# Patient Record
Sex: Female | Born: 1945 | Race: White | Hispanic: No | Marital: Single
Health system: Southern US, Community
[De-identification: ages and names within clinical notes are randomized; demographics above are authoritative.]

---

## 2004-09-12 ENCOUNTER — Other Ambulatory Visit: Payer: Self-pay

## 2005-03-21 ENCOUNTER — Ambulatory Visit: Payer: Self-pay | Admitting: Internal Medicine

## 2005-05-09 ENCOUNTER — Other Ambulatory Visit: Payer: Self-pay

## 2005-05-10 ENCOUNTER — Ambulatory Visit: Payer: Self-pay | Admitting: General Practice

## 2005-09-05 ENCOUNTER — Ambulatory Visit: Payer: Self-pay | Admitting: Internal Medicine

## 2006-03-08 ENCOUNTER — Ambulatory Visit: Payer: Self-pay | Admitting: Internal Medicine

## 2006-07-05 ENCOUNTER — Ambulatory Visit: Payer: Self-pay | Admitting: Internal Medicine

## 2007-05-14 ENCOUNTER — Ambulatory Visit: Payer: Self-pay | Admitting: Internal Medicine

## 2007-06-24 ENCOUNTER — Ambulatory Visit: Payer: Self-pay | Admitting: Gastroenterology

## 2007-07-07 ENCOUNTER — Ambulatory Visit: Payer: Self-pay | Admitting: Gastroenterology

## 2008-02-26 ENCOUNTER — Ambulatory Visit: Payer: Self-pay | Admitting: Internal Medicine

## 2009-01-06 ENCOUNTER — Ambulatory Visit: Payer: Self-pay | Admitting: Internal Medicine

## 2009-03-02 ENCOUNTER — Ambulatory Visit: Payer: Self-pay | Admitting: Internal Medicine

## 2010-12-28 ENCOUNTER — Ambulatory Visit: Payer: Self-pay | Admitting: Internal Medicine

## 2011-01-08 ENCOUNTER — Emergency Department: Payer: Self-pay | Admitting: Emergency Medicine

## 2011-02-08 ENCOUNTER — Ambulatory Visit: Payer: Self-pay

## 2011-08-01 ENCOUNTER — Ambulatory Visit: Payer: Self-pay | Admitting: General Practice

## 2011-09-13 ENCOUNTER — Ambulatory Visit: Payer: Self-pay | Admitting: General Practice

## 2011-12-10 ENCOUNTER — Ambulatory Visit: Payer: Self-pay | Admitting: General Practice

## 2011-12-10 LAB — CBC
MCHC: 34.4 g/dL (ref 32.0–36.0)
RBC: 4.23 10*6/uL (ref 3.80–5.20)
WBC: 9.1 10*3/uL (ref 3.6–11.0)

## 2011-12-10 LAB — URINALYSIS, COMPLETE
Glucose,UR: NEGATIVE mg/dL (ref 0–75)
Nitrite: NEGATIVE
Specific Gravity: 1.018 (ref 1.003–1.030)
Squamous Epithelial: 2

## 2011-12-10 LAB — BASIC METABOLIC PANEL
Anion Gap: 12 (ref 7–16)
Calcium, Total: 9.2 mg/dL (ref 8.5–10.1)
Co2: 27 mmol/L (ref 21–32)
EGFR (African American): >60

## 2011-12-10 LAB — SEDIMENTATION RATE: Erythrocyte Sed Rate: 12 mm/hr (ref 0–30)

## 2011-12-10 LAB — MRSA PCR SCREENING

## 2011-12-10 LAB — PROTIME-INR: INR: 1

## 2011-12-12 LAB — URINE CULTURE

## 2011-12-21 ENCOUNTER — Inpatient Hospital Stay: Payer: Self-pay | Admitting: General Practice

## 2011-12-22 LAB — BASIC METABOLIC PANEL
Anion Gap: 9 (ref 7–16)
Chloride: 100 mmol/L (ref 98–107)
Co2: 25 mmol/L (ref 21–32)
Creatinine: 0.66 mg/dL (ref 0.60–1.30)
Osmolality: 270 (ref 275–301)

## 2011-12-23 LAB — BASIC METABOLIC PANEL
BUN: 7 mg/dL (ref 7–18)
Calcium, Total: 8 mg/dL — ABNORMAL LOW (ref 8.5–10.1)
Chloride: 98 mmol/L (ref 98–107)
Creatinine: 0.47 mg/dL — ABNORMAL LOW (ref 0.60–1.30)
EGFR (Non-African Amer.): >60
Glucose: 80 mg/dL (ref 65–99)
Osmolality: 265 (ref 275–301)
Potassium: 3.3 mmol/L — ABNORMAL LOW (ref 3.5–5.1)
Sodium: 134 mmol/L — ABNORMAL LOW (ref 136–145)

## 2011-12-23 LAB — HEMOGLOBIN: HGB: 11 g/dL — ABNORMAL LOW (ref 12.0–16.0)

## 2011-12-24 LAB — BASIC METABOLIC PANEL
BUN: 8 mg/dL (ref 7–18)
Calcium, Total: 8.9 mg/dL (ref 8.5–10.1)
Chloride: 100 mmol/L (ref 98–107)
Co2: 28 mmol/L (ref 21–32)
Creatinine: 0.61 mg/dL (ref 0.60–1.30)
Glucose: 65 mg/dL (ref 65–99)
Osmolality: 270 (ref 275–301)
Potassium: 3.4 mmol/L — ABNORMAL LOW (ref 3.5–5.1)

## 2012-04-08 ENCOUNTER — Ambulatory Visit: Payer: Self-pay | Admitting: Internal Medicine

## 2012-11-04 ENCOUNTER — Ambulatory Visit: Payer: Self-pay | Admitting: Orthopedic Surgery

## 2013-01-05 ENCOUNTER — Ambulatory Visit: Payer: Self-pay | Admitting: Gastroenterology

## 2013-02-04 ENCOUNTER — Other Ambulatory Visit: Payer: Self-pay | Admitting: Gastroenterology

## 2013-02-04 DIAGNOSIS — K5730 Diverticulosis of large intestine without perforation or abscess without bleeding: Secondary | ICD-10-CM

## 2013-02-18 ENCOUNTER — Ambulatory Visit
Admission: RE | Admit: 2013-02-18 | Discharge: 2013-02-18 | Disposition: A | Discharge status: Home / Self Care | Payer: No Typology Code available for payment source | Source: Ambulatory Visit | Attending: Gastroenterology | Admitting: Gastroenterology

## 2013-02-18 DIAGNOSIS — K5730 Diverticulosis of large intestine without perforation or abscess without bleeding: Secondary | ICD-10-CM

## 2013-05-19 ENCOUNTER — Ambulatory Visit: Payer: Self-pay | Admitting: Gastroenterology

## 2013-06-18 ENCOUNTER — Ambulatory Visit: Payer: Self-pay | Admitting: Gastroenterology

## 2013-10-02 IMAGING — CT CT VIRTUAL COLONOSCOPY DIAGNOSTIC
3 of 6 series · 12 of 36 positions shown, 18 images · non-contrast
Comparison: None.

CLINICAL DATA: Incomplete optical colonoscopy.  Left lower quadrant
pain and chronic diarrhea.

CT VIRTUAL COLONOSCOPY DIAGNOSTIC
TECHNIQUE: The patient was given a standard LoSo bowel
preparation with Gastrografin and barium for fluid and stool
tagging respectively.  The quality of the bowel preparation is
moderate.  Automated CO2 insufflation of the colon was performed
prior to image acquisition and colonic distention is for moderate
in the sigmoid colon, otherwise moderate to excellent..  Image post
processing was used to generate a 3D endoluminal fly-through
projection of the colon and to electronically subtract stool/fluid
as appropriate.

[Series 2: supine (id) · axial · 0.74mm/px · z∈[-396,-92]mm · 7 of 326 slices shown, 12 images]
[im 41/326  soft-tissue]
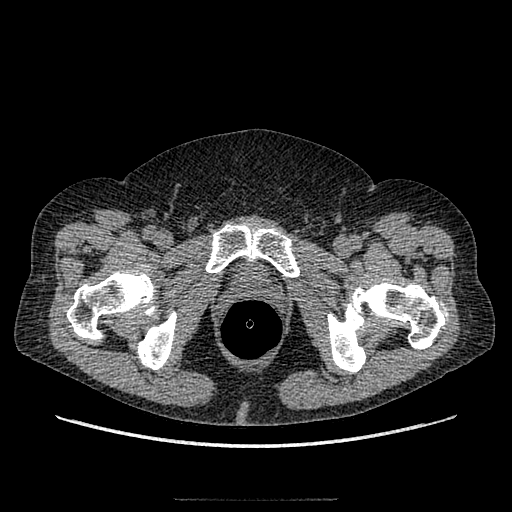
[im 41/326  bone]
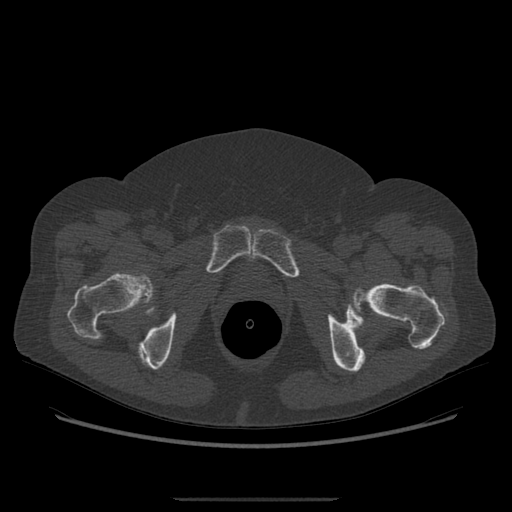
[im 82/326  soft-tissue]
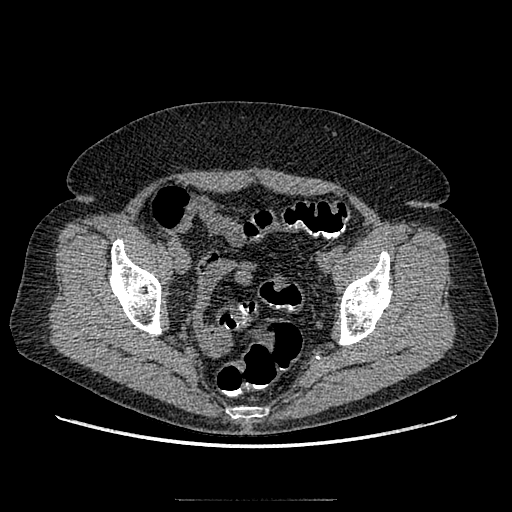
[im 122/326  soft-tissue]
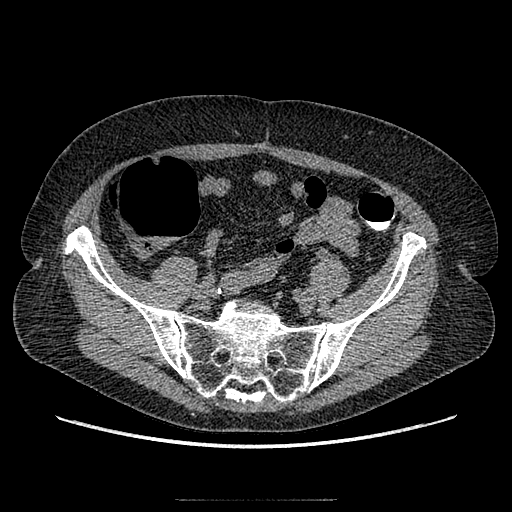
[im 163/326  soft-tissue]
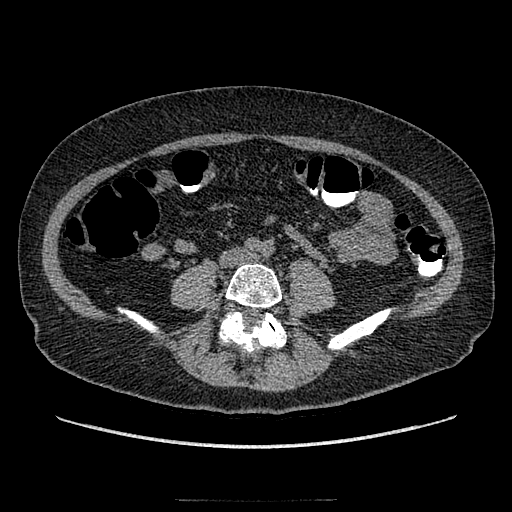
[im 163/326  lung]
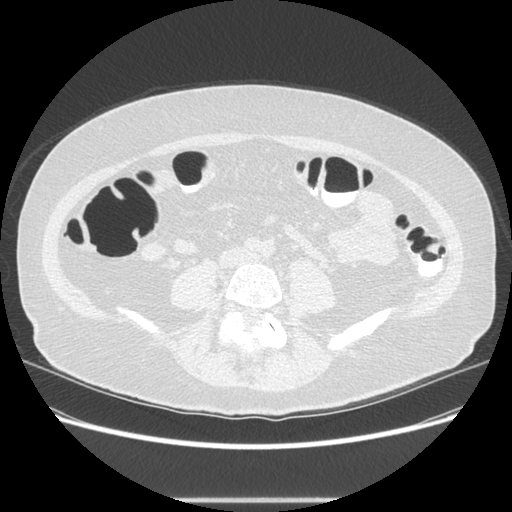
[im 204/326  soft-tissue]
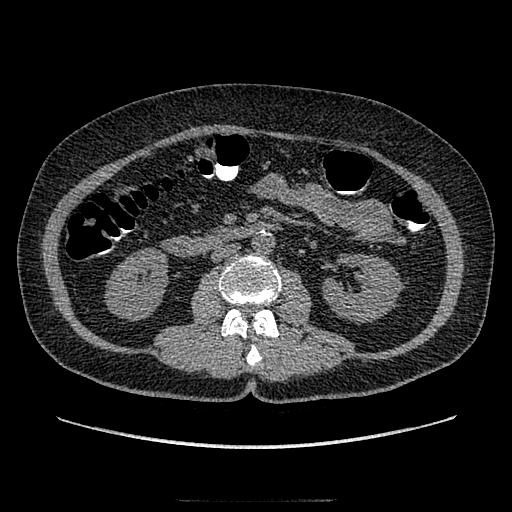
[im 204/326  lung]
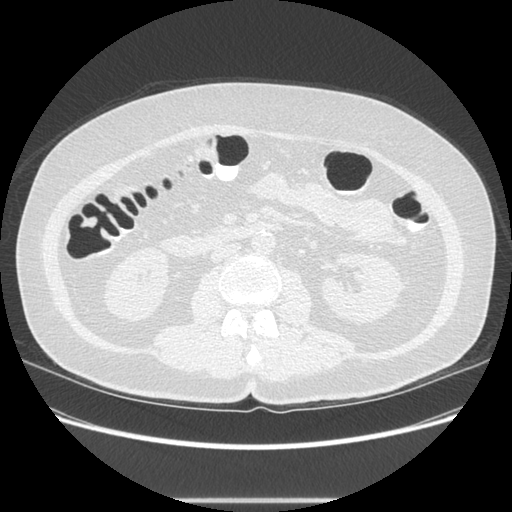
[im 244/326  soft-tissue]
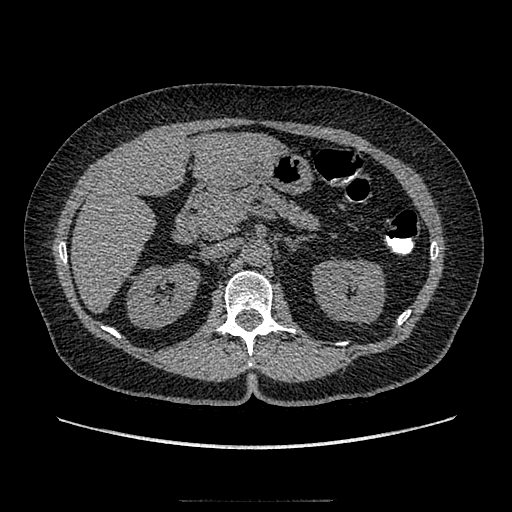
[im 244/326  lung]
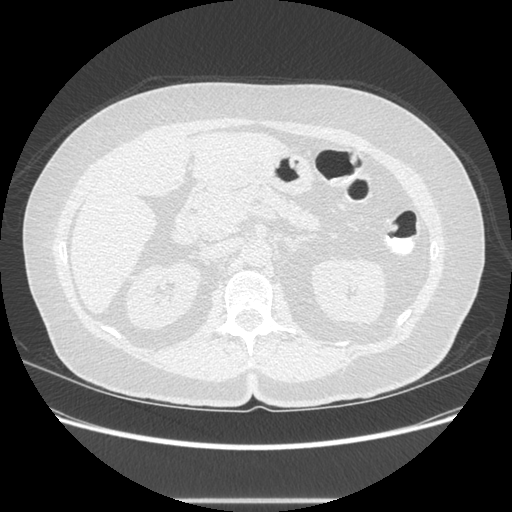
[im 285/326  soft-tissue]
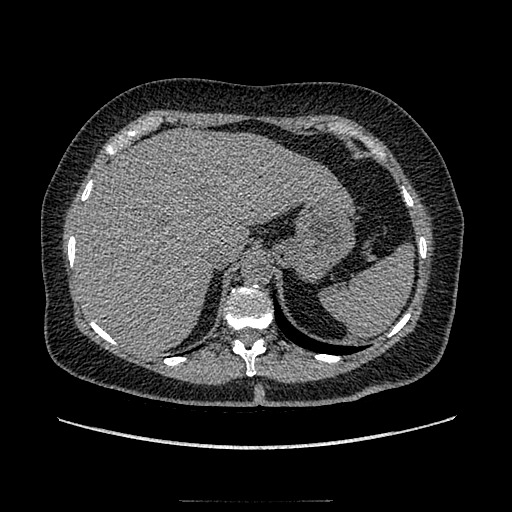
[im 285/326  lung]
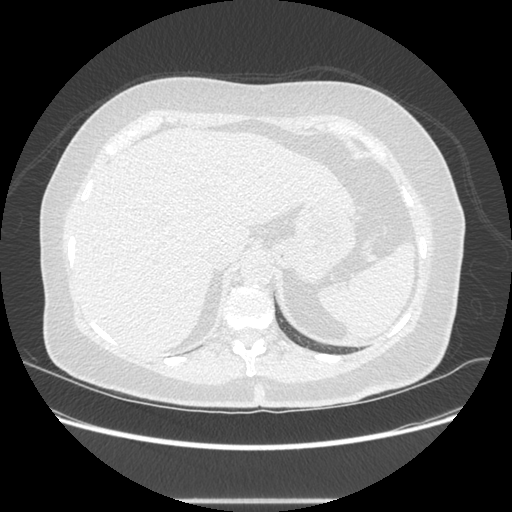

[Series 6: prone (id) · axial · 0.70mm/px · z∈[-394,-244]mm · 4 of 321 slices shown]
[im 41/321  soft-tissue]
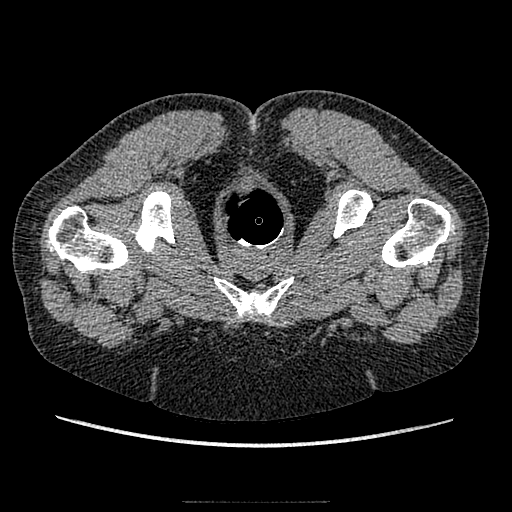
[im 81/321  soft-tissue]
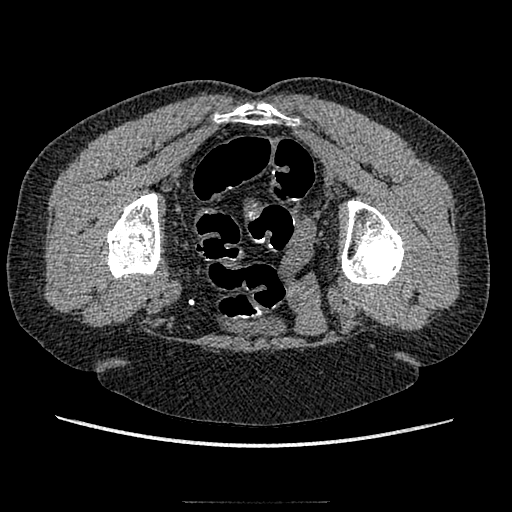
[im 121/321  soft-tissue]
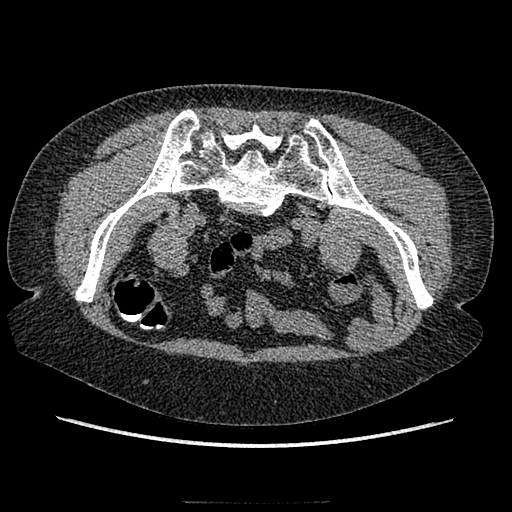
[im 161/321  soft-tissue]
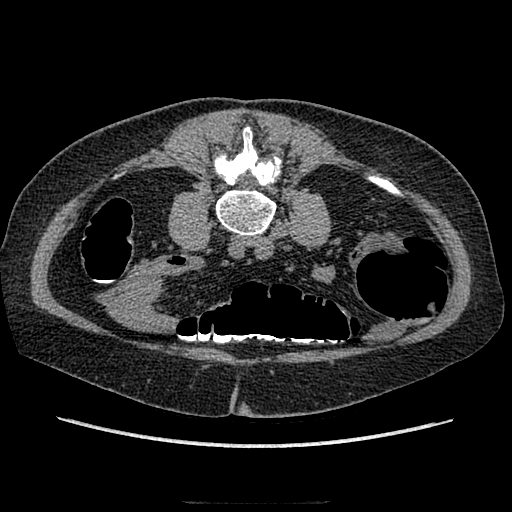

[Series 601: coronal body · coronal · 0.79mm/px · 1 of 109 slices shown, 2 images]
[im 37/109  soft-tissue]
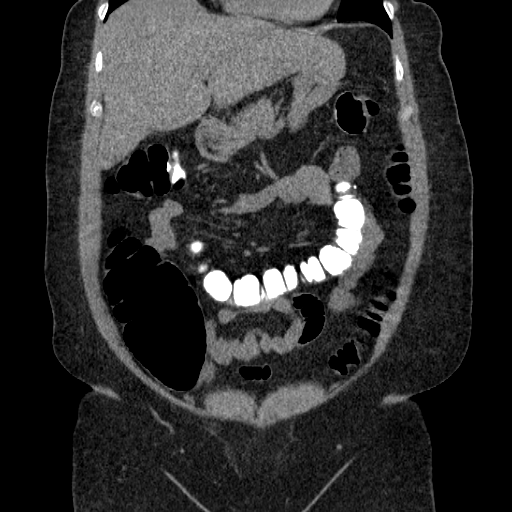
[im 37/109  bone]
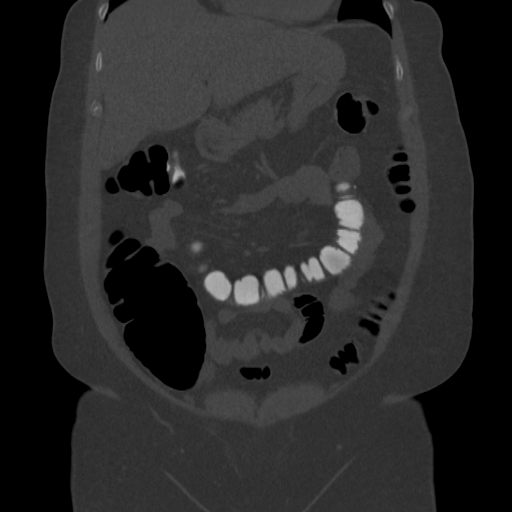

[12 of 36 positions shown; findings below may reference images not displayed]

FINDINGS: Sigmoid colon is rather under distended, limiting
evaluation.  Otherwise, no definite polyp, mass or stricture.
Scattered diverticula.
IMPRESSION: Sigmoid colon is inadequately distended, limiting evaluation.
Otherwise, no definite polyp, mass or stricture.

Virtual colonoscopy is not designed to detect diminutive polyps
(i.e., less than or equal to 5 mm), the presence or absence of
which may not affect clinical management

CT ABDOMEN AND PELVIS WITHOUT CONTRAST
FINDINGS: Lung bases show no acute findings.  Heart size normal.
Pre pericardiac lymph node is sub centimeter in size.  No
pericardial or pleural effusion.

An 8 mm low attenuation lesion in the right hepatic lobe is too
small to characterize.  Cholecystectomy.  Liver, gallbladder,
adrenal glands and right kidney are otherwise unremarkable.  A
cm low attenuation lesion in the upper pole left kidney is
difficult to definitively characterize without postcontrast
imaging.  Spleen, pancreas, stomach and small bowel are
unremarkable.  Colon is discussed in the virtual colonoscopy
section of this report.

Atherosclerotic calcification of the arterial vasculature without
abdominal aortic aneurysm.  No pathologically enlarged lymph nodes.
No free fluid.  Hysterectomy.  Curvilinear calcification or clip in
the inferior left omentum.  No worrisome lytic or sclerotic
lesions.
IMPRESSION: No acute findings in the abdomen or pelvis.

## 2013-12-31 IMAGING — US ABDOMEN ULTRASOUND LIMITED
1 series · 13 of 25 positions shown · non-contrast
Comparison: none

REASON FOR EXAM: liver and kidneys  liver lesion  abd pain abn GI xray
COMMENTS:

[Series 1: abdomen ultrasound limited · 0.28mm/px · 13 of 80 slices shown]
[im 1/80]
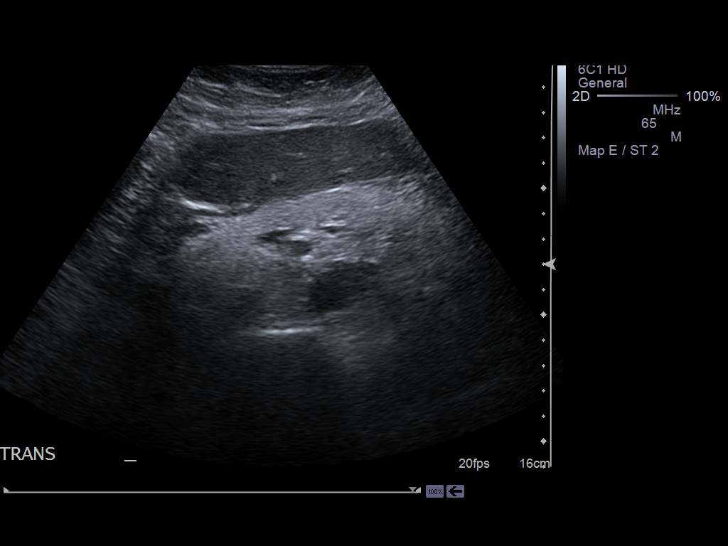
[im 7/80]
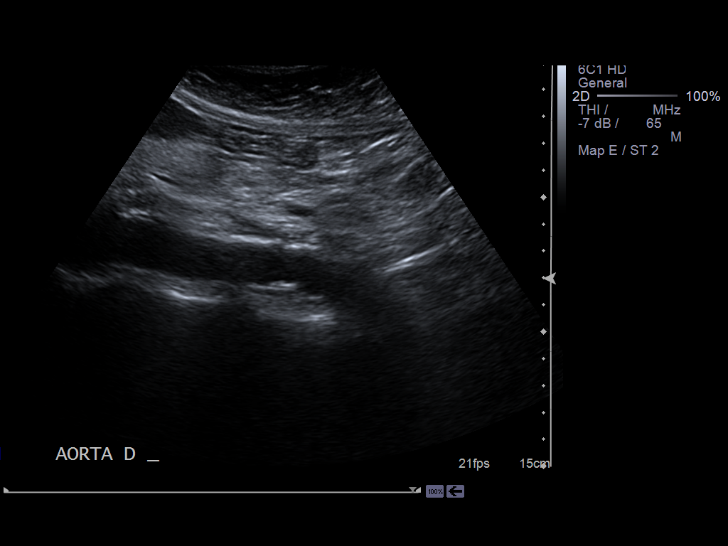
[im 14/80]
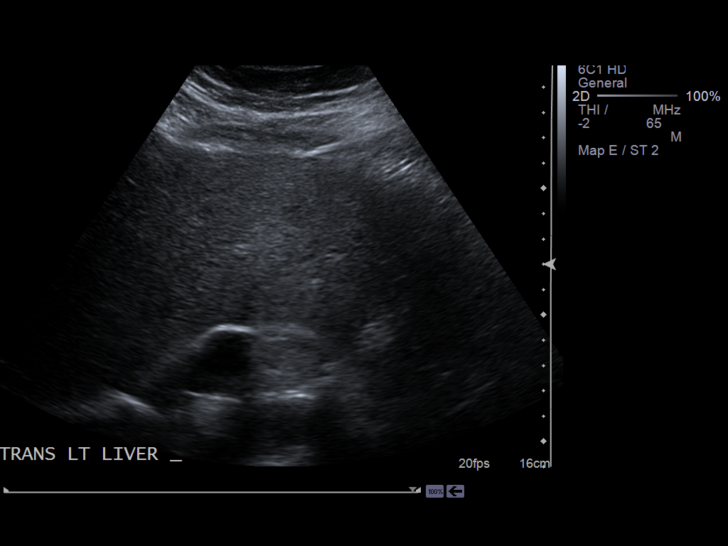
[im 20/80]
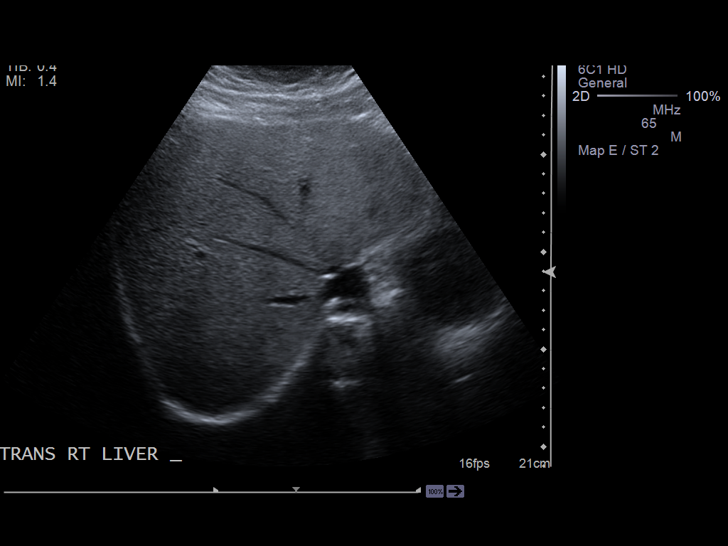
[im 27/80]
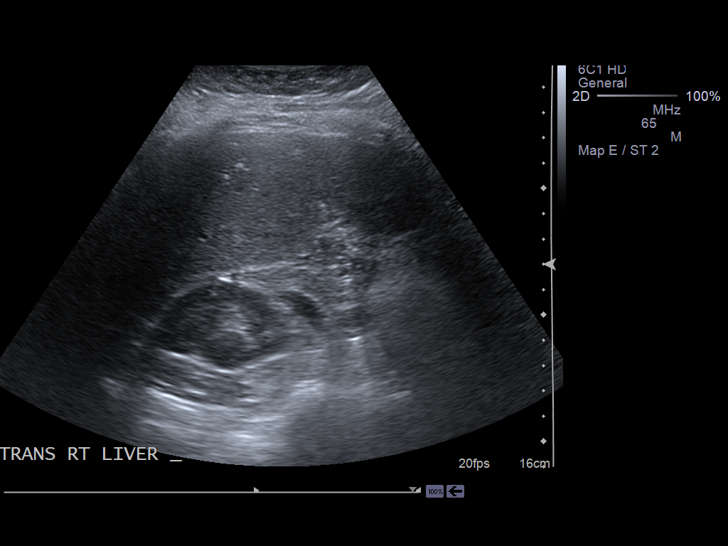
[im 33/80]
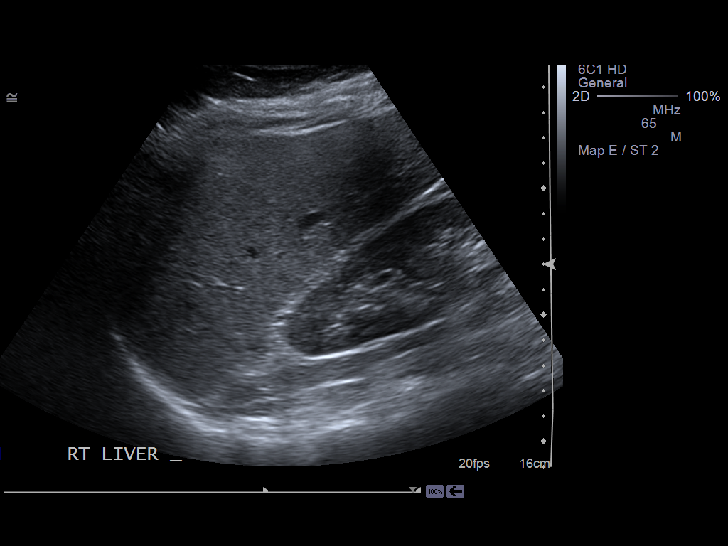
[im 40/80]
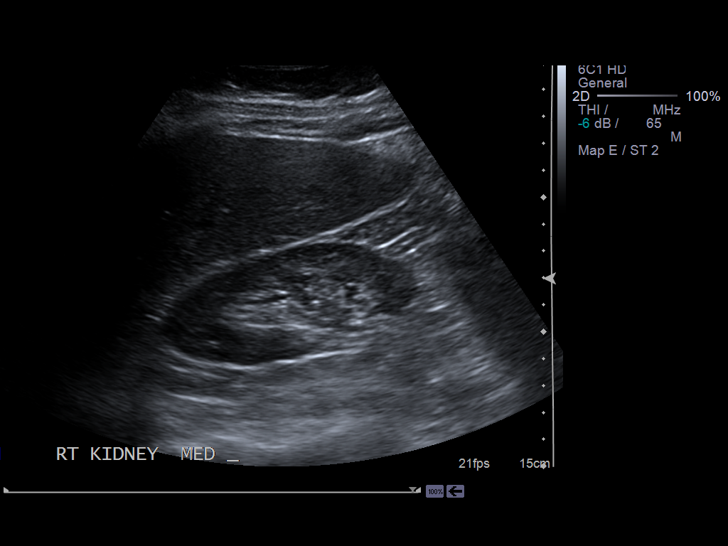
[im 47/80]
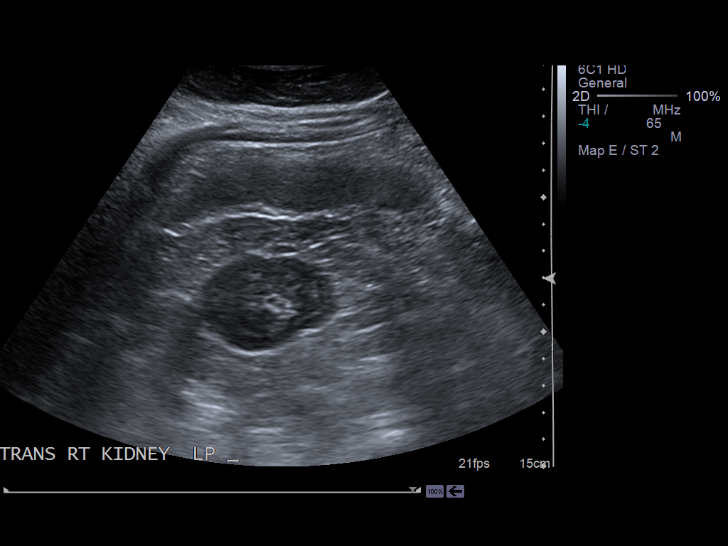
[im 53/80]
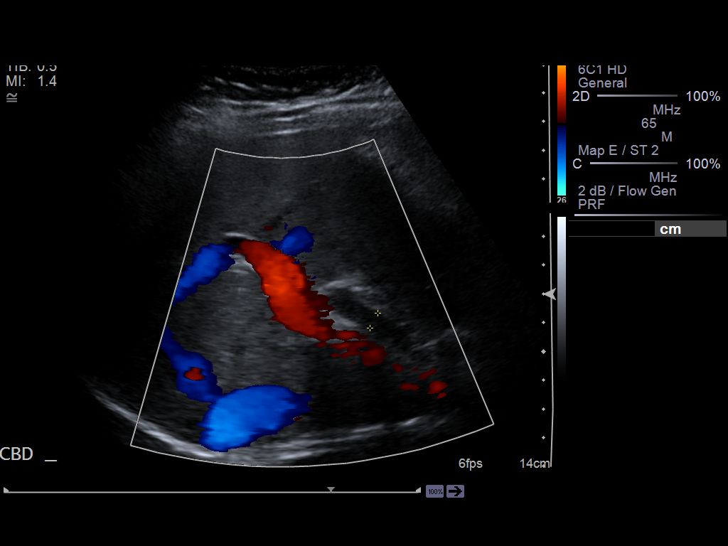
[im 60/80]
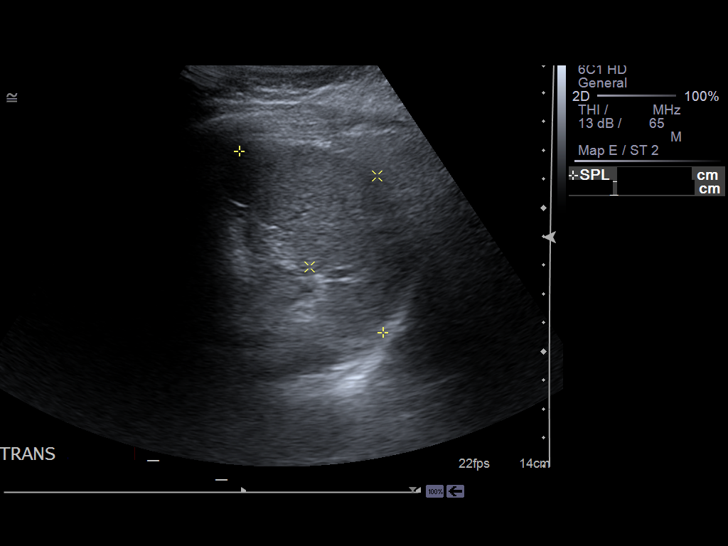
[im 66/80]
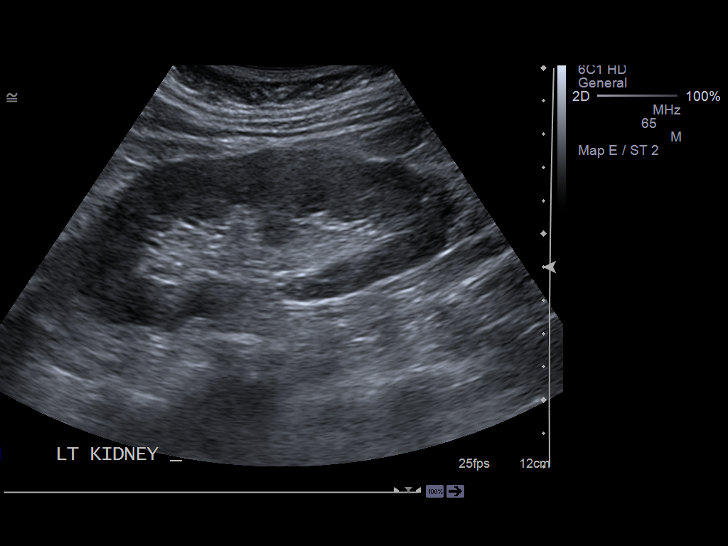
[im 73/80]
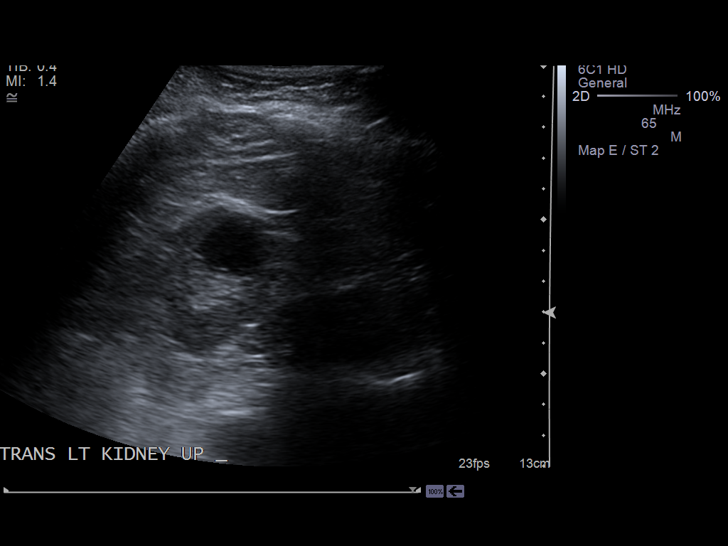
[im 80/80]
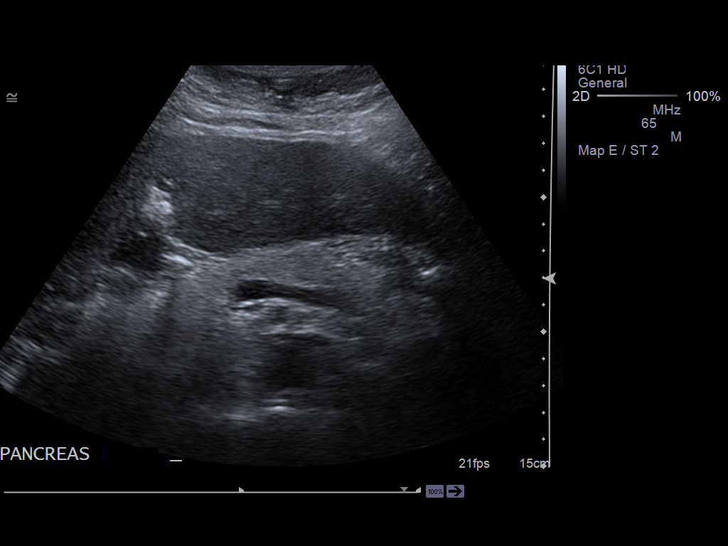

[13 of 25 positions shown; findings below may reference images not displayed]

PROCEDURE:     BANDO - BANDO ABDOMEN UPPER GENERAL  - May 19, 2013 [DATE]

RESULT:     The liver exhibits normal echotexture with no focal mass nor
ductal dilation. Portal venous flow is normal in direction toward the liver.
The gallbladder is surgically absent. There is no positive sonographic
Murphy's sign. The common bile duct measures 0.9 mm in diameter. The
pancreas exhibits no focal mass or ductal dilation where visualized. The
spleen is normal in size measuring 8.5 cm in long axis. The abdominal aorta
and inferior vena cava are normal in appearance. The kidneys exhibit no
evidence of obstruction. The right kidney measures 11.1 cm in length and
contains a lower pole presumed cyst measuring approximately 9 x 6 x 7 mm.
The left kidney measures 11.9 cm in length and contains a 2.5 x 2.7 x 2.4 cm
diameter upper pole cyst.
IMPRESSION: 1. No hepatic masses are demonstrated.
2. There is likely a subcentimeter cyst in the lower pole of the right
kidney and a cyst measuring 2.7 cm in greatest dimension in the upper pole
of the left kidney.
3. The spleen, visualized portions of the pancreas, and abdominal aorta are
normal in appearance.

Given the findings on the virtual colonoscopy in the lack of definite
abnormality on the current ultrasound, a CT scan of the abdomen and pelvis
is recommended.

[REDACTED]

## 2015-02-18 NOTE — Op Note (Signed)
PATIENT NAME:  [name], [name] DATE OF BIRTH:  1946-07-16  DATE OF PROCEDURE:  11/04/2012  PREOPERATIVE DIAGNOSIS: Right de Quervain's stenosing tenosynovitis.   POSTOPERATIVE DIAGNOSIS: Right de Quervain's stenosing tenosynovitis.   PROCEDURE PERFORMED: De Quervain's release.   ANESTHESIA: General.    SURGEON: Leitha SchullerMichael J. Lenna Hagarty, MD   DESCRIPTION OF PROCEDURE: The patient was brought to the operating room, and after adequate anesthesia was obtained, the right arm was prepped and draped in the usual sterile fashion, with a tourniquet applied to the upper arm. After appropriate patient identification and timeout procedures were carried out, the tourniquet was raised to 250 mmHg. An incision was made over the radial styloid over the palpable swollen area just through the skin. The subcutaneous tissue was spread to preserve branches of superficial radial nerve. With retractors placed, the thick extensor retinaculum was incised, and there was a large amount of fluid within the tendon sheath. The 2 tendons were identified and released proximally, so there was no longer any impingement. There was a septation between these, and this was also excised. There did not appear to be accessory slips of the tendons. There appeared to be complete release of the tendons at this point, and the tendons were in good condition without evidence of abrasion. The wound was then irrigated and closed with simple interrupted 5-0 nylon skin suture. Then, 10 mL of 0.5% Sensorcaine without epinephrine was infiltrated proximal to the incision for postoperative analgesia. Sterile dressing with Xeroform, 4 x 4, Webril and Ace wrap applied and the tourniquet let down. The patient was sent to the recovery room in stable condition.   ESTIMATED BLOOD LOSS: Minimal.   COMPLICATIONS: None.   SPECIMEN: None.   TOURNIQUET TIME: 10 minutes at 250 mmHg.   ____________________________ Leitha SchullerMichael J. Delesia Martinek,  MD mjm:[email or web address] D: 11/04/2012 23:03:11 ET T: 11/05/2012 06:04:02 ET JOB#: [id number]  cc: Leitha SchullerMichael J. Chareese Sergent, MD, <Dictator> Leitha SchullerMICHAEL J Avie Checo MD ELECTRONICALLY SIGNED 11/05/2012 8:30

## 2015-02-20 NOTE — Op Note (Signed)
PATIENT NAME:  [name], [name] DATE OF BIRTH:  10/04/1946  DATE OF PROCEDURE:  12/21/2011  PREOPERATIVE DIAGNOSIS: Degenerative arthrosis of the right knee.   POSTOPERATIVE DIAGNOSIS: Degenerative arthrosis of the right knee.   PROCEDURE PERFORMED: Right total knee arthroplasty using computer-assisted navigation.   SURGEON: Illene LabradorJames P. Hooten, M.D.   ASSISTANT: Van ClinesJon Wolfe, PA-C (required to maintain retraction throughout the procedure).   ANESTHESIA: Femoral nerve block and spinal.   ESTIMATED BLOOD LOSS: 100 mL.  FLUIDS REPLACED: 1,850 mL of crystalloid.  TOURNIQUET TIME: 101 minutes.  DRAINS: Two medium drains to reinfusion system.   SOFT TISSUE RELEASES: Anterior cruciate ligament, posterior cruciate ligament, deep and superficial medial collateral ligament and patellofemoral ligament.   IMPLANTS UTILIZED: DePuy PFC Sigma size two posterior stabilized femoral component (cemented), size two MBT tibial component (cemented), 32 mm three peg oval dome patella (cemented), and a 12.5 mm stabilized rotating platform polyethylene insert. Gentamicin cement was utilized due to the patient's history of diabetes.   INDICATIONS FOR SURGERY: The patient is a 69 year old female who has been seen for complaints of progressive right knee pain. Previous arthroscopy demonstrated significant degenerative changes to the medial compartment, and serial radiographs demonstrated progression of cartilage space loss along the medial aspect. She had not seen any significant improvements despite conservative nonsurgical intervention. After a discussion of the risks and benefits of surgical intervention, the patient expressed her understanding of the risks and benefits and agreed with plans for surgical intervention.   PROCEDURE IN DETAIL: The patient was brought to the Operating Room and, after adequate femoral nerve block and spinal anesthesia was achieved, a tourniquet was placed on the patient's upper  right thigh. The patient's right knee and leg were cleaned and prepped with alcohol and DuraPrep draped in the usual sterile fashion. A "time-out" was performed as per usual protocol. The right lower extremity was exsanguinated using an Esmarch, and the tourniquet was inflated to 300 mmHg. An anterior longitudinal incision was made followed by a standard mid vastus approach. A large effusion was evacuated. The deep fibers of the medial collateral ligament were elevated in a subperiosteal fashion off of the medial flare of the tibia so as to maintain a continuous soft tissue sleeve. The patella was subluxed laterally and the patellofemoral ligament was incised. Inspection of the knee demonstrated severe degenerative changes with eburnated bone noted especially to the medial compartment. Osteophytes were debrided using a rongeur. Anterior and posterior cruciate ligaments were excised. Two 4.0 mm Schanz pins were inserted into the femur and into the tibia for attachment of the ray of spheres used for computer-assisted navigation. Hip center was identified using a circumduction technique. Distal landmarks were mapped using the computer. Distal femur and proximal tibia were mapped using the computer. Distal femoral cutting guide was positioned using computer-assisted navigation so as to achieve a 5 degree distal valgus cut. Cut was performed and verified using the computer. Distal femur was sized and it was felt that a size two femoral component was appropriate. A size two cutting guide was positioned using computer-assisted navigation and the anterior cut was performed and verified using the computer. This was followed by completion of the posterior and chamfer cuts. Femoral cutting guide for the central box was then positioned and the central box cut was performed.   Attention was then directed to the proximal tibia. Medial and lateral menisci were excised. The extramedullary tibial cutting guide was positioned using  computer-assisted navigation so as to achieve  0 degree varus valgus alignment and 0 degree posterior slope. Cut was performed and verified using the computer. The proximal tibia was sized and it was felt that a size two tibial tray was appropriate. Tibial and femoral trials were inserted followed by insertion of a 10 mm polyethylene insert. The knee was felt to be tight medially. Cobb elevator was used to elevate the superficial fibers of the medial collateral ligament. A 10 mm polyethylene trial was removed and replaced with a 12.5 mm polyethylene trial. This allowed for excellent mediolateral soft tissue balancing with the knee in full extension and in 90 degrees of flexion. Finally, the patella was cut and prepared so as to accommodate a 32 mm three peg oval dome patella. Patellar trial was placed and the knee was placed through a range of motion with excellent patellar tracking appreciated. The femoral trial was removed. Central post hole for the tibial component was reamed followed by insertion of keel punch. Tibial trials were then removed. The cut surfaces of bone were irrigated with copious amounts of normal saline with antibiotic solution using pulsatile lavage and then suctioned dry. Polymethyl methacrylate cement with gentamicin was prepared in the usual fashion using a vacuum mixer. Cement was applied to the cut surface of the proximal tibia as well as along the undersurface of a size two MBT tibial component. The tibial component was positioned and impacted into place. Excess cement was removed using freer elevators. Cement was then applied to the cut surface of the femur as well as along the posterior flanges of a size two posterior stabilized femoral component. Femoral component was positioned and impacted into place. Excess cement was removed using freer elevators. A 12.5 mm stabilized polyethylene trial was inserted and the knee was brought in full extension with steady axial compression applied.  Finally, cement was applied to the backside of a 32 mm three peg oval dome patella and the patellar component was positioned and patellar clamp applied. Excess cement was removed using freer elevators.   After adequate curing of cement, the tourniquet was deflated after total tourniquet time of 101 minutes. Hemostasis was achieved using electrocautery. The knee was irrigated with copious amounts of normal saline with antibiotic solution using pulsatile lavage and then suctioned dry. The knee was inspected for any residual cement debris. 30 mL of 0.25% Marcaine with epinephrine was injected along the posterior capsule. A 12.5 mm stabilized rotating platform polyethylene insert was inserted and the knee was placed through a range of motion. Again, excellent mediolateral soft tissue balancing was appreciated and excellent patellar tracking was appreciated. Two medium drains were placed in the wound bed and brought out through a separate stab incision so as to be attached to a reinfusion system. The medial parapatellar portion of the incision was reapproximated using interrupted sutures of #1 Vicryl. The subcutaneous tissue was approximated in layers using first #0 Vicryl followed by 2-0 Vicryl. Skin was closed with skin staples. A sterile dressing was applied. The patient tolerated the procedure well. She was transported to the recovery room in stable condition.   ____________________________ Illene Labrador. Angie Fava., MD jph:ap D: 12/22/2011 16:03:29 ET T: 12/23/2011 12:06:21 ET JOB#: [id number]  cc: Illene Labrador. Angie Fava., MD, <Dictator> JAMES P Angie Fava MD ELECTRONICALLY SIGNED 12/25/2011 6:22

## 2015-02-20 NOTE — Discharge Summary (Signed)
PATIENT NAME:  [name], [name] MR#:  [id number] DATE OF BIRTH:  January 20, 1946  DATE OF ADMISSION:  12/21/2011 DATE OF DISCHARGE:  12/24/2011   ADMITTING DIAGNOSIS: Degenerative arthrosis of right knee.   DISCHARGE DIAGNOSIS: Degenerative arthrosis of right knee.   HISTORY: The patient is a 69 year old female who [facility] progression of right knee pain. She was four months status post right knee arthroscopy for partial medial meniscectomy as well as chondroplasty of the medial compartment of the knee. 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 The patient had not seen any significant improvement in her right knee despite physical therapy as well as viscous supplementation. She had not seen any improvement with the use of anti-inflammatory medication. At the time of surgery, she was not using any ambulatory aid. She had localized most of the pain along the medial aspect of the knee. Her pain was aggravated with weightbearing activities. She denied any gross locking, swelling, or giving way of the knee. The right knee pain had progressed to the point that it was significantly interfering with her activities of daily living. X-rays taken in the clinic showed narrowing of the medial cartilage space. Early subchondral sclerosis was noted. There was minimal osteophyte formation noted. Patellofemoral degenerative changes were noted. After discussion of the risks and benefits of surgical intervention, the patient expressed her understanding of the risks and benefits and agreed for plans for surgical intervention.   PROCEDURE: Right total knee arthroplasty using computer-assisted navigation.   ANESTHESIA: Femoral nerve block with spinal.   SOFT TISSUE RELEASE: Anterior cruciate ligament, posterior cruciate ligament, deep and superficial medial collateral ligament as well as the patellofemoral ligament.   IMPLANTS UTILIZED:  DePuy PFC Sigma size 2 posterior stabilized femoral component (cemented), size 2 MBT tibial component (cemented), 32 mm three pegged oval dome patella (cemented), and a 12.5 stabilized rotating platform polyethylene insert. Gentamicin cement was used secondary to the patient's history of diabetes.   HOSPITAL COURSE: The patient tolerated the procedure very well. She had no complications. She was then taken to PAC-U where she was stabilized and then transferred to the Orthopedic floor. She began receiving anticoagulation therapy of Lovenox 30 mg sub-Q q.12 hours per anesthesia protocol. She was fitted with TED stockings bilaterally. These were allowed to be removed one hour per eight hour shift. The right one was applied on day two following removal of the Hemovac and dressing change. She was also fitted with the AV-I compression foot pumps set at 80 mmHg. Her calves have been nontender, free of any evidence of any deep venous thromboses of the lower extremity. Negative Homans sign. Heels were elevated off the bed using rolled towels. There has been no tenderness or tissue breakdown noted to either heel.   The patient has denied any chest pain or any shortness of breath. Vital signs have been stable. She has been afebrile. Hemodynamically she was stable. No transfusions were given other than the Autovac transfusion for six hours postoperatively. Laboratory studies following surgery showed she had decreased potassium of 3.3 and subsequently this was supplemented with Klor-Con 20 mEq b.i.d. Follow-up reports showed improvement.   The patient's IV, Foley, and catheter were discontinued on day two along with the dressing change. Polar Care was reapplied to the to the surgical leg maintaining a temperature of 40 to 50 degrees Fahrenheit. Wound was free of any drainage or signs of infection.   Physical  therapy was initiated on day one for gait training and transfers. She has progressed very well. Occupational  therapy was also initiated on day one for activities of daily living and assistive devices.   DISPOSITION: The patient is being discharged to skilled nursing facility in improved stable condition.   DISCHARGE INSTRUCTIONS:  1. She will follow-up in the clinic on March 8th at 8:15 and April 1st at 9:15 with Dr. Ernest Pine.  2. She is to continue with TED stockings bilaterally. These are allowed to be removed one hour per eight hour shift.  3. Weight bear as tolerated.  4. Elevate heels off the bed.  5. Incentive spirometer q.1 hour while awake.  6. Encourage cough, deep breathing q.2 hours while awake.  7. She may weight bear as tolerated.  8. She will receive PT for gait training and transfers. 9. OT for activities of daily living and assistive devices.  10. She is placed on an 1800 calorie diet.  11. Polar Care to the surgical leg maintaining a temperature of 40 to 50 degrees Fahrenheit.   DRUG ALLERGIES: Sulfa allergy.   MEDICATIONS:  1. Tylenol ES 500 to 1000 mg q.4 hours p.r.n.  2. Mylanta DS 30 mL q.6 hours p.r.n.  3. Dulcolax suppository 10 mg rectally daily p.r.n. for constipation.  4. Wellbutrin SR 150 mg b.i.d.  5. Senokot-S 1 tablet b.i.d.  6. Cosopt optic drops one drop both eyes b.i.d.  7. Lovenox 30 mg sub-Q q.12 hours for 14 days, then discontinue and begin taking one 81 mg enteric-coated aspirin.  8. Insulin Novolin 70/30 30 units sub-Q at bedtime.  9. Insulin Novolin 70/30 injection 60 units sub-Q q.a.c. breakfast.  10. Insulin sliding scale Novolin R.  11. Latanoprost 0.005% optic drops one drop both eyes b.i.d.  12. Milk of Magnesia 30 mL b.i.d.  13. Skelaxin 800 mg b.i.d. p.r.n.  14. Metformin 1000 mg b.i.d. with meal.  15. Multivitamin 1 capsule daily.  16. Brimonidine 0.2% optic solution one drop both eyes b.i.d.  17. Fish Oil 1 gram daily. 18. Oxycodone 5 to 10 mg q.4 to 6 hours p.r.n. for pain. 19. Tramadol 50 to 100 mg b.i.d. q.4 to 6 hours p.r.n.   20. Pantoprazole 40 mg b.i.d.    PAST MEDICAL HISTORY:  1. Diabetes.  2. Glaucoma.  3. Shingles.  4. Hyperplastic polyps.   ____________________________ Van Clines, PA [id number] D: 12/24/2011 15:07:00 ET T: 12/24/2011 15:44:38 ET JOB#: [id number]  cc: Van Clines, PA, <Dictator> JON WOLFE PA ELECTRONICALLY SIGNED 12/29/2011 19:14

## 2015-02-20 NOTE — Discharge Summary (Signed)
PATIENT NAME:  [name], [name] MR#:  [id number] DATE OF BIRTH:  [birth year]  DATE OF ADMISSION:  12/21/2011 DATE OF DISCHARGE:  12/25/2011  ADDENDUM:   HOSPITAL COURSE: The patient was tentatively to be discharged today to a skilled nursing facility, but at the last moment because of improvement in her physical therapy, she has elected to return home.  DISCHARGE INSTRUCTIONS:   1. She is to continue weight-bearing as tolerated.  2. Continue with TED stockings. These are allowed to be worn during the day but removed at night.  3. Continue Polar Care to maintain a temperature of 40 to 50 degrees Fahrenheit.  4. She will continue using a walker until cleared by Physical Therapy to go to a quad cane.  5. She will receive Home Health physical therapy.  6. She was instructed on wound care.  7. She has a follow-up appointment on March 8th at 08:15.  8. She is to call the Clinic sooner if any temperatures of 101.5 or greater or excessive bleeding.  9. She is to resume her regular medication that she was on prior to admission. She was given a prescription for Lovenox 40 mg subcutaneously daily for 14 days, then discontinue and begin taking one 81 mg enteric-coated aspirin per day. Also, a prescription for Ultram 50 mg, 1 to 2 tablets every 4 to 6 hours p.r.n. for pain was given.    ____________________________ Van ClinesJon Wolfe, PA [email or web address] D: 12/25/2011 08:07:17 ET T: 12/25/2011 15:29:28 ET JOB#: [id number]  cc: Van ClinesJon Wolfe, PA, <Dictator> JON WOLFE PA ELECTRONICALLY SIGNED 12/29/2011 19:15
